# Patient Record
Sex: Male | Born: 1950 | Race: White | Hispanic: Yes | Marital: Married | Smoking: Former smoker
Health system: Southern US, Community
[De-identification: ages and names within clinical notes are randomized; demographics above are authoritative.]

## PROBLEM LIST (undated history)

## (undated) DIAGNOSIS — I10 Essential (primary) hypertension: Secondary | ICD-10-CM

## (undated) DIAGNOSIS — E039 Hypothyroidism, unspecified: Secondary | ICD-10-CM

## (undated) DIAGNOSIS — I499 Cardiac arrhythmia, unspecified: Secondary | ICD-10-CM

## (undated) DIAGNOSIS — M1990 Unspecified osteoarthritis, unspecified site: Secondary | ICD-10-CM

## (undated) HISTORY — PX: OTHER SURGICAL HISTORY: SHX169

## (undated) HISTORY — PX: EYE SURGERY: SHX253

---

## 2020-08-20 ENCOUNTER — Other Ambulatory Visit: Payer: Self-pay | Admitting: Orthopedic Surgery

## 2020-08-20 DIAGNOSIS — Z01811 Encounter for preprocedural respiratory examination: Secondary | ICD-10-CM

## 2020-08-28 ENCOUNTER — Other Ambulatory Visit (HOSPITAL_COMMUNITY): Payer: Self-pay

## 2020-09-08 NOTE — Progress Notes (Signed)
[place]- Preparing for Total Shoulder Arthroplasty    Before surgery, you can play an important role. Because skin is not sterile, your skin needs to be as free of germs as possible. You can reduce the number of germs on your skin by using the following products. . Benzoyl Peroxide Gel o Reduces the number of germs present on the skin o Applied twice a day to shoulder area starting two days before surgery    ==================================================================  Please follow these instructions carefully:  BENZOYL PEROXIDE 5% GEL  Please do not use if you have an allergy to benzoyl peroxide.   If your skin becomes reddened/irritated stop using the benzoyl peroxide.  Starting two days before surgery, apply as follows: 1. Apply benzoyl peroxide in the morning and at night. Apply after taking a shower. If you are not taking a shower clean entire shoulder front, back, and side along with the armpit with a clean wet washcloth.  2. Place a quarter-sized dollop on your shoulder and rub in thoroughly, making sure to cover the front, back, and side of your shoulder, along with the armpit.   2 days before ____ AM   ____ PM              1 day before ____ AM   ____ PM                         3. Do this twice a day for two days.  (Last application is the night before surgery, AFTER using the CHG soap as described below).  4. Do NOT apply benzoyl peroxide gel on the day of surgery.  

## 2020-09-08 NOTE — Progress Notes (Signed)
DUE TO COVID-19 ONLY ONE VISITOR IS ALLOWED TO COME WITH YOU AND STAY IN THE WAITING ROOM ONLY DURING PRE OP AND PROCEDURE DAY OF SURGERY. THE 1 VISITOR  MAY VISIT WITH YOU AFTER SURGERY IN YOUR PRIVATE ROOM DURING VISITING HOURS ONLY!  YOU NEED TO HAVE A COVID 19 TEST ON___2/14/2022 ____ @_______ , THIS TEST MUST BE DONE BEFORE SURGERY,  COVID TESTING SITE [address] [place]  , IT IS ON THE RIGHT GOING OUT [address] APPROXIMATELY  2 MINUTES PAST [employer] ON THE RIGHT. ONCE YOUR COVID TEST IS COMPLETED,  PLEASE BEGIN THE QUARANTINE INSTRUCTIONS AS OUTLINED IN YOUR HANDOUT.                Roger Robbins  09/08/2020   Your procedure is scheduled on: 09/17/2020    Report to [facility] Main  Entrance   Report to admitting at     0715 AM     Call this number if you have problems the morning of surgery [phone]    REMEMBER: NO  SOLID FOOD CANDY OR GUM AFTER MIDNIGHT. CLEAR LIQUIDS UNTIL     0645am        . NOTHING BY MOUTH EXCEPT CLEAR LIQUIDS UNTIL    . PLEASE FINISH ENSURE DRINK PER SURGEON ORDER  WHICH NEEDS TO BE COMPLETED AT   0645am    .      CLEAR LIQUID DIET   Foods Allowed                                                                    Coffee and tea, regular and decaf                            Fruit ices (not with fruit pulp)                                      Iced Popsicles                                    Carbonated beverages, regular and diet                                    Cranberry, grape and apple juices Sports drinks like Gatorade Lightly seasoned clear broth or consume(fat free) Sugar, honey syrup ___________________________________________________________________      BRUSH YOUR TEETH MORNING OF SURGERY AND RINSE YOUR MOUTH OUT, NO CHEWING GUM CANDY OR MINTS.     Take these medicines the morning of surgery with A SIP OF WATER:  Amiodarone, Zyrtec, Clonidine, hydralazine, synthroid, Bystolic  DO NOT  TAKE ANY DIABETIC MEDICATIONS DAY OF YOUR SURGERY                               You may not have any metal on your body including hair pins and  piercings  Do not wear jewelry, make-up, lotions, powders or perfumes, deodorant             Do not wear nail polish on your fingernails.  Do not shave  48 hours prior to surgery.              Men may shave face and neck.   Do not bring valuables to the hospital. [place].  Contacts, dentures or bridgework may not be worn into surgery.  Leave suitcase in the car. After surgery it may be brought to your room.     Patients discharged the day of surgery will not be allowed to drive home. IF YOU ARE HAVING SURGERY AND GOING HOME THE SAME DAY, YOU MUST HAVE AN ADULT TO DRIVE YOU HOME AND BE WITH YOU FOR 24 HOURS. YOU MAY GO HOME BY TAXI OR UBER OR ORTHERWISE, BUT AN ADULT MUST ACCOMPANY YOU HOME AND STAY WITH YOU FOR 24 HOURS.  Name and phone number of your driver:  Special Instructions: N/A              Please read over the following fact sheets you were given: _____________________________________________________________________  [facility] - Preparing for Surgery Before surgery, you can play an important role.  Because skin is not sterile, your skin needs to be as free of germs as possible.  You can reduce the number of germs on your skin by washing with CHG (chlorahexidine gluconate) soap before surgery.  CHG is an antiseptic cleaner which kills germs and bonds with the skin to continue killing germs even after washing. Please DO NOT use if you have an allergy to CHG or antibacterial soaps.  If your skin becomes reddened/irritated stop using the CHG and inform your nurse when you arrive at Short Stay. Do not shave (including legs and underarms) for at least 48 hours prior to the first CHG shower.  You may shave your face/neck. Please follow these instructions carefully:  1.  Shower with  CHG Soap the night before surgery and the  morning of Surgery.  2.  If you choose to wash your hair, wash your hair first as usual with your  normal  shampoo.  3.  After you shampoo, rinse your hair and body thoroughly to remove the  shampoo.                           4.  Use CHG as you would any other liquid soap.  You can apply chg directly  to the skin and wash                       Gently with a scrungie or clean washcloth.  5.  Apply the CHG Soap to your body ONLY FROM THE NECK DOWN.   Do not use on face/ open                           Wound or open sores. Avoid contact with eyes, ears mouth and genitals (private parts).                       Wash face,  Genitals (private parts) with your normal soap.             6.  Wash thoroughly, paying special attention to the area where your surgery  will be performed.  7.  Thoroughly rinse your body with warm water from the neck down.  8.  DO NOT shower/wash with your normal soap after using and rinsing off  the CHG Soap.                9.  Pat yourself dry with a clean towel.            10.  Wear clean pajamas.            11.  Place clean sheets on your bed the night of your first shower and do not  sleep with pets. Day of Surgery : Do not apply any lotions/deodorants the morning of surgery.  Please wear clean clothes to the hospital/surgery center.  FAILURE TO FOLLOW THESE INSTRUCTIONS MAY RESULT IN THE CANCELLATION OF YOUR SURGERY PATIENT SIGNATURE_________________________________  NURSE SIGNATURE__________________________________  ________________________________________________________________________

## 2020-09-09 ENCOUNTER — Encounter (HOSPITAL_COMMUNITY)
Admission: RE | Admit: 2020-09-09 | Discharge: 2020-09-09 | Disposition: A | Discharge status: Home / Self Care | Payer: Medicare Other | Source: Ambulatory Visit | Attending: Orthopedic Surgery | Admitting: Orthopedic Surgery

## 2020-09-09 ENCOUNTER — Other Ambulatory Visit: Payer: Self-pay

## 2020-09-09 ENCOUNTER — Encounter (HOSPITAL_COMMUNITY): Payer: Self-pay

## 2020-09-09 ENCOUNTER — Ambulatory Visit (HOSPITAL_COMMUNITY)
Admission: RE | Admit: 2020-09-09 | Discharge: 2020-09-09 | Disposition: A | Discharge status: Home / Self Care | Payer: Medicare Other | Source: Ambulatory Visit | Attending: Orthopedic Surgery | Admitting: Orthopedic Surgery

## 2020-09-09 DIAGNOSIS — I10 Essential (primary) hypertension: Secondary | ICD-10-CM | POA: Insufficient documentation

## 2020-09-09 DIAGNOSIS — Z01818 Encounter for other preprocedural examination: Secondary | ICD-10-CM | POA: Diagnosis not present

## 2020-09-09 DIAGNOSIS — Z87891 Personal history of nicotine dependence: Secondary | ICD-10-CM | POA: Diagnosis not present

## 2020-09-09 DIAGNOSIS — Z7982 Long term (current) use of aspirin: Secondary | ICD-10-CM | POA: Diagnosis not present

## 2020-09-09 DIAGNOSIS — Z79899 Other long term (current) drug therapy: Secondary | ICD-10-CM | POA: Insufficient documentation

## 2020-09-09 DIAGNOSIS — Z01811 Encounter for preprocedural respiratory examination: Secondary | ICD-10-CM | POA: Insufficient documentation

## 2020-09-09 DIAGNOSIS — M75101 Unspecified rotator cuff tear or rupture of right shoulder, not specified as traumatic: Secondary | ICD-10-CM | POA: Insufficient documentation

## 2020-09-09 HISTORY — DX: Unspecified osteoarthritis, unspecified site: M19.90

## 2020-09-09 HISTORY — DX: Cardiac arrhythmia, unspecified: I49.9

## 2020-09-09 HISTORY — DX: Essential (primary) hypertension: I10

## 2020-09-09 HISTORY — DX: Hypothyroidism, unspecified: E03.9

## 2020-09-09 LAB — CBC WITH DIFFERENTIAL/PLATELET
Abs Immature Granulocytes: 0.04 10*3/uL (ref 0.00–0.07)
Basophils Absolute: 0.1 10*3/uL (ref 0.0–0.1)
Basophils Relative: 1 %
Eosinophils Absolute: 0.2 10*3/uL (ref 0.0–0.5)
Eosinophils Relative: 2 %
HCT: 39 % (ref 39.0–52.0)
Hemoglobin: 13 g/dL (ref 13.0–17.0)
Immature Granulocytes: 1 %
Lymphocytes Relative: 18 %
Lymphs Abs: 1.4 10*3/uL (ref 0.7–4.0)
MCH: 32.4 pg (ref 26.0–34.0)
MCHC: 33.3 g/dL (ref 30.0–36.0)
MCV: 97.3 fL (ref 80.0–100.0)
Monocytes Absolute: 0.6 10*3/uL (ref 0.1–1.0)
Monocytes Relative: 8 %
Neutro Abs: 5.5 10*3/uL (ref 1.7–7.7)
Neutrophils Relative %: 70 %
Platelets: 276 10*3/uL (ref 150–400)
RBC: 4.01 MIL/uL — ABNORMAL LOW (ref 4.22–5.81)
RDW: 12.4 % (ref 11.5–15.5)
WBC: 7.9 10*3/uL (ref 4.0–10.5)
nRBC: 0 % (ref 0.0–0.2)

## 2020-09-09 LAB — URINALYSIS, ROUTINE W REFLEX MICROSCOPIC
Bilirubin Urine: NEGATIVE
Glucose, UA: NEGATIVE mg/dL
Hgb urine dipstick: NEGATIVE
Ketones, ur: NEGATIVE mg/dL
Leukocytes,Ua: NEGATIVE
Nitrite: NEGATIVE
Protein, ur: NEGATIVE mg/dL
Specific Gravity, Urine: 1.011 (ref 1.005–1.030)
pH: 6 (ref 5.0–8.0)

## 2020-09-09 LAB — COMPREHENSIVE METABOLIC PANEL
ALT: 22 U/L (ref 0–44)
AST: 25 U/L (ref 15–41)
Albumin: 4 g/dL (ref 3.5–5.0)
Alkaline Phosphatase: 83 U/L (ref 38–126)
Anion gap: 9 (ref 5–15)
BUN: 18 mg/dL (ref 8–23)
CO2: 27 mmol/L (ref 22–32)
Calcium: 9.1 mg/dL (ref 8.9–10.3)
Chloride: 103 mmol/L (ref 98–111)
Creatinine, Ser: 1.36 mg/dL — ABNORMAL HIGH (ref 0.61–1.24)
GFR, Estimated: 56 mL/min — ABNORMAL LOW (ref >60)
Glucose, Bld: 100 mg/dL — ABNORMAL HIGH (ref 70–99)
Potassium: 4.7 mmol/L (ref 3.5–5.1)
Sodium: 139 mmol/L (ref 135–145)
Total Bilirubin: 0.8 mg/dL (ref 0.3–1.2)
Total Protein: 7.6 g/dL (ref 6.5–8.1)

## 2020-09-09 LAB — PROTIME-INR
INR: 1 (ref 0.8–1.2)
Prothrombin Time: 12.9 seconds (ref 11.4–15.2)

## 2020-09-09 LAB — SURGICAL PCR SCREEN
MRSA, PCR: NEGATIVE
Staphylococcus aureus: NEGATIVE

## 2020-09-09 LAB — APTT: aPTT: 32 seconds (ref 24–36)

## 2020-09-09 NOTE — Progress Notes (Addendum)
Anesthesia Review:  PCP: DR Argentina Ponder in [place]  Cardiologist : DR Sherren Mocha in [place] Requested by fax with Medical Release most recent office visit notes, ekg and echo.  PT reports LOV as being either 10/21 or 11/21.   04/15/20, 02/14/2020- LOv notes from Dr Leonie Green EKg-02/24/20, Echo 04/15/20 -   Chest x-ray : EKG :09/09/20  Echo : Stress test: Cardiac Cath :  Activity level: can do a fight of stairs without difficulty  Sleep Study/ CPAP :no  Fasting Blood Sugar :      / Checks Blood Sugar -- times a day:   Blood Thinner/ Instructions /Last Dose: ASA / Instructions/ Last Dose :  81 mg aspirin  Pulse by [employer] was 40-45.  Pt reports pulse as normal being 40-45.  Pt voices no complaints at preop.  Called DR Leonie Green and will need to send Medical Release form to obtain records.  PT signed Medical Release Form. Faxed to request records.  Gayla Medicus made aware heart rate at 40 and asymptomatic.  EKG done. Heart rate 38 on EKG.    REport shown to Jodell Cipro,, PA . Pt reports he has not taken meds yet this am and has in his truck.  Made Gayla Medicus aware.  No new orders given. B/P 168/75 at preop.  Pt reports blood pressure normally elevated " I have white coat syndrome"

## 2020-09-15 NOTE — Progress Notes (Signed)
Anesthesia Chart Review   Case: [id number] Date/Time: 09/17/20 0931   Procedure: REVERSE SHOULDER ARTHROPLASTY (Right Shoulder)   Anesthesia type: Choice   Pre-op diagnosis: RIGHT SHOULDER ROTATOR CUFF TEAR ARTHROPATHY   Location: [facility] ROOM 07 / [facility]   Surgeons: Jones Broom, MD      DISCUSSION:70 y.o. former msoker with h/o HTN, dilated cardiomyopathy, bradycardia, right shoulder rotator cuff tear scheduled for above procedure 09/17/2020 with Dr. Jones Broom.   Pt last seen by cardiology 04/15/2020. Notes on chart.  Per OV note, pt euvolemic, moderate MR is asymptomatic, frequent ventricular ectopy followed by EP.  Denies CV sx.   Echo 04/17/2020 with EF 60-65%, mild mitral regurgitation.    VS: BP (!) 168/75   Pulse (!) 40   Temp 36.6 C (Oral)   Resp 16   SpO2 100%   PROVIDERS: Vanessa , FNP  Is PCP   Altamease Oiler, MD is Cardiologist   LABS: Labs reviewed: Acceptable for surgery. (all labs ordered are listed, but only abnormal results are displayed)  Labs Reviewed  CBC WITH DIFFERENTIAL/PLATELET - Abnormal; Notable for the following components:      Result Value   RBC 4.01 (*)    All other components within normal limits  COMPREHENSIVE METABOLIC PANEL - Abnormal; Notable for the following components:   Glucose, Bld 100 (*)    Creatinine, Ser 1.36 (*)    GFR, Estimated 56 (*)    All other components within normal limits  SURGICAL PCR SCREEN  PROTIME-INR  APTT  URINALYSIS, ROUTINE W REFLEX MICROSCOPIC  TYPE AND SCREEN     IMAGES:   EKG: 09/09/20 Rate 38 bpm  Marked sinus bradycardia T wave abnormality, consider anterior ischemia  CV: Echo 04/17/2020 Impression Normal LV size with normal systolic function.  The ejection fraction is 60-65%. There is normal left ventricular wall thickness Normal right ventricular size with normal function Moderately dilated left atrium Poorly seen right atrium There is mild mitral regurgitation There is mild  tricuspid regurgitation There is mild pulmonic regurgitation Past Medical History:  Diagnosis Date  . Arthritis   . Dysrhythmia    PVCS   . Hypertension   . Hypothyroidism     Past Surgical History:  Procedure Laterality Date  . 3 FINERS ON LEFT HAND REATTACHED     . BILATERAL BLEPHOROPLASTY     . EYE SURGERY     BILATERAL CATARACT SURGERY   . REBUILT KNEE SURGERY     . RIGHT KNEE REPLACEMENT       MEDICATIONS: . acetaminophen (TYLENOL) 500 MG tablet  . amiodarone (PACERONE) 200 MG tablet  . aspirin EC 81 MG tablet  . cetirizine (ZYRTEC ALLERGY) 10 MG tablet  . Cholecalciferol (D3) 50 MCG (2000 UT) TABS  . cloNIDine (CATAPRES) 0.1 MG tablet  . ezetimibe (ZETIA) 10 MG tablet  . hydrALAZINE (APRESOLINE) 100 MG tablet  . latanoprost (XALATAN) 0.005 % ophthalmic solution  . levothyroxine (SYNTHROID) 125 MCG tablet  . metolazone (ZAROXOLYN) 5 MG tablet  . naproxen sodium (ALEVE) 220 MG tablet  . nebivolol (BYSTOLIC) 10 MG tablet  . vitamin C (ASCORBIC ACID) 500 MG tablet   No current facility-administered medications for this encounter.    Jodell Cipro, PA-C [facility]-Surgical Testing [phone]

## 2020-09-17 ENCOUNTER — Encounter (HOSPITAL_COMMUNITY)
Admission: RE | Disposition: A | Discharge status: Home / Self Care | Payer: Self-pay | Source: Home / Self Care | Attending: Orthopedic Surgery

## 2020-09-17 ENCOUNTER — Ambulatory Visit (HOSPITAL_COMMUNITY): Payer: Medicare Other | Admitting: Physician Assistant

## 2020-09-17 ENCOUNTER — Ambulatory Visit (HOSPITAL_COMMUNITY)
Admission: RE | Admit: 2020-09-17 | Discharge: 2020-09-17 | Disposition: A | Discharge status: Home / Self Care | Payer: Medicare Other | Attending: Orthopedic Surgery | Admitting: Orthopedic Surgery

## 2020-09-17 ENCOUNTER — Ambulatory Visit (HOSPITAL_COMMUNITY): Payer: Medicare Other | Admitting: Certified Registered Nurse Anesthetist

## 2020-09-17 ENCOUNTER — Encounter (HOSPITAL_COMMUNITY): Payer: Self-pay | Admitting: Orthopedic Surgery

## 2020-09-17 ENCOUNTER — Ambulatory Visit (HOSPITAL_COMMUNITY): Payer: Medicare Other

## 2020-09-17 DIAGNOSIS — M75101 Unspecified rotator cuff tear or rupture of right shoulder, not specified as traumatic: Secondary | ICD-10-CM | POA: Diagnosis not present

## 2020-09-17 DIAGNOSIS — Z79899 Other long term (current) drug therapy: Secondary | ICD-10-CM | POA: Diagnosis not present

## 2020-09-17 DIAGNOSIS — Z880 Allergy status to penicillin: Secondary | ICD-10-CM | POA: Insufficient documentation

## 2020-09-17 DIAGNOSIS — Z79890 Hormone replacement therapy (postmenopausal): Secondary | ICD-10-CM | POA: Insufficient documentation

## 2020-09-17 DIAGNOSIS — Z87891 Personal history of nicotine dependence: Secondary | ICD-10-CM | POA: Insufficient documentation

## 2020-09-17 DIAGNOSIS — Z96611 Presence of right artificial shoulder joint: Secondary | ICD-10-CM

## 2020-09-17 DIAGNOSIS — Z7982 Long term (current) use of aspirin: Secondary | ICD-10-CM | POA: Diagnosis not present

## 2020-09-17 DIAGNOSIS — M19011 Primary osteoarthritis, right shoulder: Secondary | ICD-10-CM | POA: Insufficient documentation

## 2020-09-17 DIAGNOSIS — M25711 Osteophyte, right shoulder: Secondary | ICD-10-CM | POA: Insufficient documentation

## 2020-09-17 HISTORY — PX: REVERSE SHOULDER ARTHROPLASTY: SHX5054

## 2020-09-17 LAB — ABO/RH: ABO/RH(D): A POS

## 2020-09-17 LAB — TYPE AND SCREEN
ABO/RH(D): A POS
Antibody Screen: NEGATIVE

## 2020-09-17 SURGERY — ARTHROPLASTY, SHOULDER, TOTAL, REVERSE
Anesthesia: Regional | Site: Shoulder | Laterality: Right

## 2020-09-17 MED ORDER — PROPOFOL 10 MG/ML IV BOLUS
INTRAVENOUS | Status: DC | PRN
  Administered 2020-09-17: 160 mg via INTRAVENOUS

## 2020-09-17 MED ORDER — CEFAZOLIN SODIUM-DEXTROSE 2-4 GM/100ML-% IV SOLN
INTRAVENOUS | Status: AC
  Filled 2020-09-17: qty 100

## 2020-09-17 MED ORDER — ONDANSETRON HCL 4 MG/2ML IJ SOLN
INTRAMUSCULAR | Status: AC
  Filled 2020-09-17: qty 2

## 2020-09-17 MED ORDER — ROCURONIUM BROMIDE 10 MG/ML (PF) SYRINGE
PREFILLED_SYRINGE | INTRAVENOUS | Status: AC
  Filled 2020-09-17: qty 10

## 2020-09-17 MED ORDER — BUPIVACAINE LIPOSOME 1.3 % IJ SUSP
INTRAMUSCULAR | Status: DC | PRN
  Administered 2020-09-17: 10 mL via PERINEURAL

## 2020-09-17 MED ORDER — DEXAMETHASONE SODIUM PHOSPHATE 10 MG/ML IJ SOLN
INTRAMUSCULAR | Status: DC | PRN
  Administered 2020-09-17: 5 mg

## 2020-09-17 MED ORDER — FENTANYL CITRATE (PF) 100 MCG/2ML IJ SOLN
INTRAMUSCULAR | Status: AC
  Filled 2020-09-17: qty 2

## 2020-09-17 MED ORDER — OXYCODONE-ACETAMINOPHEN 5-325 MG PO TABS: ORAL_TABLET | ORAL | 0 refills | Status: AC

## 2020-09-17 MED ORDER — EPHEDRINE 5 MG/ML INJ
INTRAVENOUS | Status: AC
  Filled 2020-09-17: qty 10

## 2020-09-17 MED ORDER — HYDROMORPHONE HCL 1 MG/ML IJ SOLN: 0.2500 mg | INTRAMUSCULAR | Status: DC | PRN

## 2020-09-17 MED ORDER — ROCURONIUM BROMIDE 10 MG/ML (PF) SYRINGE
PREFILLED_SYRINGE | INTRAVENOUS | Status: DC | PRN
  Administered 2020-09-17: 60 mg via INTRAVENOUS

## 2020-09-17 MED ORDER — OXYCODONE HCL 5 MG PO TABS
5.0000 mg | ORAL_TABLET | Freq: Once | ORAL | Status: AC | PRN
  Administered 2020-09-17: 5 mg via ORAL

## 2020-09-17 MED ORDER — PHENYLEPHRINE HCL-NACL 10-0.9 MG/250ML-% IV SOLN
INTRAVENOUS | Status: DC | PRN
  Administered 2020-09-17: 30 ug/min via INTRAVENOUS

## 2020-09-17 MED ORDER — GLYCOPYRROLATE PF 0.2 MG/ML IJ SOSY
PREFILLED_SYRINGE | INTRAMUSCULAR | Status: DC | PRN
  Administered 2020-09-17: .2 mg via INTRAVENOUS

## 2020-09-17 MED ORDER — OXYCODONE HCL 5 MG/5ML PO SOLN: 5.0000 mg | Freq: Once | ORAL | Status: AC | PRN

## 2020-09-17 MED ORDER — OXYCODONE HCL 5 MG PO TABS
ORAL_TABLET | ORAL | Status: AC
  Filled 2020-09-17: qty 1

## 2020-09-17 MED ORDER — LACTATED RINGERS IV SOLN: INTRAVENOUS | Status: DC

## 2020-09-17 MED ORDER — 0.9 % SODIUM CHLORIDE (POUR BTL) OPTIME
TOPICAL | Status: DC | PRN
  Administered 2020-09-17: 1000 mL

## 2020-09-17 MED ORDER — FENTANYL CITRATE (PF) 250 MCG/5ML IJ SOLN
INTRAMUSCULAR | Status: DC | PRN
  Administered 2020-09-17 (×2): 50 ug via INTRAVENOUS
  Administered 2020-09-17: 100 ug via INTRAVENOUS

## 2020-09-17 MED ORDER — CEFAZOLIN SODIUM-DEXTROSE 2-4 GM/100ML-% IV SOLN
2.0000 g | INTRAVENOUS | Status: AC
  Administered 2020-09-17: 2 g via INTRAVENOUS

## 2020-09-17 MED ORDER — DEXAMETHASONE SODIUM PHOSPHATE 10 MG/ML IJ SOLN
INTRAMUSCULAR | Status: AC
  Filled 2020-09-17: qty 1

## 2020-09-17 MED ORDER — LACTATED RINGERS IV BOLUS: 500.0000 mL | Freq: Once | INTRAVENOUS | Status: DC

## 2020-09-17 MED ORDER — WATER FOR IRRIGATION, STERILE IR SOLN
Status: DC | PRN
  Administered 2020-09-17: 2000 mL

## 2020-09-17 MED ORDER — SODIUM CHLORIDE 0.9 % IR SOLN
Status: DC | PRN
  Administered 2020-09-17: 1000 mL

## 2020-09-17 MED ORDER — SUGAMMADEX SODIUM 200 MG/2ML IV SOLN
INTRAVENOUS | Status: DC | PRN
  Administered 2020-09-17: 300 mg via INTRAVENOUS

## 2020-09-17 MED ORDER — FENTANYL CITRATE (PF) 100 MCG/2ML IJ SOLN
50.0000 ug | INTRAMUSCULAR | Status: DC
  Administered 2020-09-17: 100 ug via INTRAVENOUS
  Administered 2020-09-17: 50 ug via INTRAVENOUS
  Filled 2020-09-17: qty 2

## 2020-09-17 MED ORDER — PROPOFOL 10 MG/ML IV BOLUS
INTRAVENOUS | Status: AC
  Filled 2020-09-17: qty 20

## 2020-09-17 MED ORDER — MIDAZOLAM HCL 2 MG/2ML IJ SOLN
1.0000 mg | INTRAMUSCULAR | Status: DC
  Administered 2020-09-17: 1 mg via INTRAVENOUS
  Filled 2020-09-17: qty 2

## 2020-09-17 MED ORDER — BUPIVACAINE HCL (PF) 0.5 % IJ SOLN
INTRAMUSCULAR | Status: DC | PRN
  Administered 2020-09-17: 10 mL via PERINEURAL

## 2020-09-17 MED ORDER — PROMETHAZINE HCL 25 MG/ML IJ SOLN: 6.2500 mg | INTRAMUSCULAR | Status: DC | PRN

## 2020-09-17 MED ORDER — EPHEDRINE SULFATE-NACL 50-0.9 MG/10ML-% IV SOSY
PREFILLED_SYRINGE | INTRAVENOUS | Status: DC | PRN
  Administered 2020-09-17: 5 mg via INTRAVENOUS

## 2020-09-17 MED ORDER — LIDOCAINE 2% (20 MG/ML) 5 ML SYRINGE
INTRAMUSCULAR | Status: DC | PRN
  Administered 2020-09-17: 60 mg via INTRAVENOUS

## 2020-09-17 MED ORDER — VASOPRESSIN 20 UNIT/ML IV SOLN
INTRAVENOUS | Status: AC
  Filled 2020-09-17: qty 1

## 2020-09-17 MED ORDER — TRANEXAMIC ACID-NACL 1000-0.7 MG/100ML-% IV SOLN
1000.0000 mg | INTRAVENOUS | Status: AC
  Administered 2020-09-17: 1000 mg via INTRAVENOUS
  Filled 2020-09-17: qty 100

## 2020-09-17 MED ORDER — DEXAMETHASONE SODIUM PHOSPHATE 10 MG/ML IJ SOLN
INTRAMUSCULAR | Status: DC | PRN
  Administered 2020-09-17: 5 mg via INTRAVENOUS

## 2020-09-17 MED ORDER — LIDOCAINE HCL (PF) 2 % IJ SOLN
INTRAMUSCULAR | Status: AC
  Filled 2020-09-17: qty 5

## 2020-09-17 MED ORDER — ONDANSETRON HCL 4 MG/2ML IJ SOLN
INTRAMUSCULAR | Status: DC | PRN
  Administered 2020-09-17: 4 mg via INTRAVENOUS

## 2020-09-17 MED ORDER — CHLORHEXIDINE GLUCONATE 0.12 % MT SOLN
15.0000 mL | Freq: Once | OROMUCOSAL | Status: AC
  Administered 2020-09-17: 15 mL via OROMUCOSAL

## 2020-09-17 MED ORDER — TIZANIDINE HCL 4 MG PO TABS: 4.0000 mg | ORAL_TABLET | Freq: Three times a day (TID) | ORAL | 1 refills | Status: AC | PRN

## 2020-09-17 SURGICAL SUPPLY — 73 items
BAG ZIPLOCK 12X15 (MISCELLANEOUS) ×2 IMPLANT
BASEPLATE P2 COATD GLND 6.5X30 (Shoulder) ×1 IMPLANT
BIT DRILL 1.6MX128 (BIT) IMPLANT
BIT DRILL 2.5 DIA 127 CALI (BIT) ×2 IMPLANT
BIT DRILL 4 DIA CALIBRATED (BIT) ×2 IMPLANT
BLADE SAW SAG 73X25 THK (BLADE) ×1
BLADE SAW SGTL 73X25 THK (BLADE) ×1 IMPLANT
BOOTIES KNEE HIGH SLOAN (MISCELLANEOUS) ×4 IMPLANT
CLSR STERI-STRIP ANTIMIC 1/2X4 (GAUZE/BANDAGES/DRESSINGS) ×2 IMPLANT
COOLER ICEMAN CLASSIC (MISCELLANEOUS) ×2 IMPLANT
COVER BACK TABLE 60X90IN (DRAPES) ×2 IMPLANT
COVER SURGICAL LIGHT HANDLE (MISCELLANEOUS) ×2 IMPLANT
COVER WAND RF STERILE (DRAPES) ×2 IMPLANT
DRAPE INCISE IOBAN 66X45 STRL (DRAPES) ×2 IMPLANT
DRAPE ORTHO SPLIT 77X108 STRL (DRAPES) ×2
DRAPE POUCH INSTRU U-SHP 10X18 (DRAPES) ×2 IMPLANT
DRAPE SHEET LG 3/4 BI-LAMINATE (DRAPES) ×2 IMPLANT
DRAPE SURG 17X11 SM STRL (DRAPES) ×2 IMPLANT
DRAPE SURG ORHT 6 SPLT 77X108 (DRAPES) ×2 IMPLANT
DRAPE TOP 10253 STERILE (DRAPES) ×2 IMPLANT
DRAPE U-SHAPE 47X51 STRL (DRAPES) ×2 IMPLANT
DRSG AQUACEL AG ADV 3.5X 6 (GAUZE/BANDAGES/DRESSINGS) ×2 IMPLANT
DURAPREP 26ML APPLICATOR (WOUND CARE) ×4 IMPLANT
ELECT BLADE TIP CTD 4 INCH (ELECTRODE) ×2 IMPLANT
ELECT REM PT RETURN 15FT ADLT (MISCELLANEOUS) ×2 IMPLANT
GLOVE SRG 8 PF TXTR STRL LF DI (GLOVE) ×1 IMPLANT
GLOVE SURG ENC MOIS LTX SZ7 (GLOVE) ×2 IMPLANT
GLOVE SURG ENC MOIS LTX SZ7.5 (GLOVE) ×2 IMPLANT
GLOVE SURG UNDER POLY LF SZ7 (GLOVE) ×2 IMPLANT
GLOVE SURG UNDER POLY LF SZ8 (GLOVE) ×1
GOWN STRL REUS W/TWL LRG LVL3 (GOWN DISPOSABLE) ×2 IMPLANT
GOWN STRL REUS W/TWL XL LVL3 (GOWN DISPOSABLE) ×2 IMPLANT
HANDPIECE INTERPULSE COAX TIP (DISPOSABLE) ×1
HOOD PEEL AWAY FLYTE STAYCOOL (MISCELLANEOUS) ×6 IMPLANT
INSERT EPOLY STND HUMERUS 32MM (Shoulder) ×2 IMPLANT
INSERT EPOLYSTD HUMERUS 32MM (Shoulder) ×1 IMPLANT
KIT BASIN OR (CUSTOM PROCEDURE TRAY) ×2 IMPLANT
KIT TURNOVER KIT A (KITS) ×2 IMPLANT
MANIFOLD NEPTUNE II (INSTRUMENTS) ×2 IMPLANT
NEEDLE TROCAR POINT SZ 2 1/2 (NEEDLE) IMPLANT
NS IRRIG 1000ML POUR BTL (IV SOLUTION) ×2 IMPLANT
P2 COATDE GLNOID BSEPLT 6.5X30 (Shoulder) ×2 IMPLANT
PACK SHOULDER (CUSTOM PROCEDURE TRAY) ×2 IMPLANT
PAD COLD SHLDR WRAP-ON (PAD) ×2 IMPLANT
PROTECTOR NERVE ULNAR (MISCELLANEOUS) IMPLANT
RESTRAINT HEAD UNIVERSAL NS (MISCELLANEOUS) ×2 IMPLANT
RETRIEVER SUT HEWSON (MISCELLANEOUS) IMPLANT
SCREW BONE LOCKING RSP 5.0X34 (Screw) ×4 IMPLANT
SCREW BONE RSP LOCK 5X18 (Screw) ×2 IMPLANT
SCREW BONE RSP LOCK 5X34 (Screw) ×2 IMPLANT
SCREW BONE RSP LOCKING 18MM LG (Screw) ×4 IMPLANT
SCREW RETAIN W/HEAD 32MM (Shoulder) ×2 IMPLANT
SET HNDPC FAN SPRY TIP SCT (DISPOSABLE) ×1 IMPLANT
SLING ARM FOAM STRAP LRG (SOFTGOODS) ×2 IMPLANT
SLING ARM IMMOBILIZER LRG (SOFTGOODS) ×2 IMPLANT
SLING ARM IMMOBILIZER MED (SOFTGOODS) IMPLANT
SPONGE LAP 18X18 RF (DISPOSABLE) ×2 IMPLANT
STEM HUMERAL STD SHELL 14X48 (Miscellaneous) ×2 IMPLANT
STRIP CLOSURE SKIN 1/2X4 (GAUZE/BANDAGES/DRESSINGS) ×4 IMPLANT
SUCTION FRAZIER HANDLE 10FR (MISCELLANEOUS)
SUCTION TUBE FRAZIER 10FR DISP (MISCELLANEOUS) IMPLANT
SUPPORT WRAP ARM LG (MISCELLANEOUS) IMPLANT
SUT ETHIBOND 2 V 37 (SUTURE) IMPLANT
SUT FIBERWIRE #2 38 REV NDL BL (SUTURE)
SUT MNCRL AB 4-0 PS2 18 (SUTURE) ×2 IMPLANT
SUT VIC AB 2-0 CT1 27 (SUTURE) ×1
SUT VIC AB 2-0 CT1 TAPERPNT 27 (SUTURE) ×1 IMPLANT
SUTURE FIBERWR#2 38 REV NDL BL (SUTURE) IMPLANT
TAPE LABRALWHITE 1.5X36 (TAPE) IMPLANT
TAPE SUT LABRALTAP WHT/BLK (SUTURE) IMPLANT
TOWEL OR 17X26 10 PK STRL BLUE (TOWEL DISPOSABLE) ×2 IMPLANT
TOWEL OR NON WOVEN STRL DISP B (DISPOSABLE) ×2 IMPLANT
WATER STERILE IRR 1000ML POUR (IV SOLUTION) ×2 IMPLANT

## 2020-09-17 NOTE — Op Note (Signed)
Procedure(s): REVERSE SHOULDER ARTHROPLASTY Procedure Note  [name] male 70 y.o. 09/17/2020   Preoperative diagnosis: Right shoulder rotator cuff tear arthropathy  Postoperative diagnosis: Same  Procedure(s) and Anesthesia Type:    Right REVERSE SHOULDER ARTHROPLASTY - General   Indications:  70 y.o. 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 Pain and dysfunction interfered with quality of life and nonoperative treatment with activity modification, NSAIDS and injections failed.     Surgeon: Berline Lopes   Assistants: Damita Lack PA-C [facility] was present and scrubbed throughout the procedure and was essential in positioning, retraction, exposure, and closure)  Anesthesia: General endotracheal anesthesia with preoperative interscalene block given by the attending anesthesiologist     Procedure Detail  REVERSE SHOULDER ARTHROPLASTY   Estimated Blood Loss:  200 mL         Drains: none  Blood Given: none          Specimens: none        Complications:  * No complications entered in OR log *         Disposition: PACU - hemodynamically stable.         Condition: stable      OPERATIVE FINDINGS:  A DJO Altivate pressfit reverse total shoulder arthroplasty was placed with a  size 14 stem, a 32 standard glenosphere, and a standard-mm poly insert. The base plate  fixation was poor.  Bone quality was felt to be very poor.  PROCEDURE: The patient was identified in the preoperative holding area  where I personally marked the operative site after verifying site, side,  and procedure with the patient. An interscalene block given by  the attending anesthesiologist in the holding area and the patient was taken back to the operating room where all extremities were  carefully padded in position after general anesthesia was induced. She  was placed in a beach-chair position and the operative upper extremity was  prepped  and draped in a standard sterile fashion. An approximately 10-  cm incision was made from the tip of the coracoid process to the center  point of the humerus at the level of the axilla. Dissection was carried  down through subcutaneous tissues to the level of the cephalic vein  which was taken laterally with the deltoid. The pectoralis major was  retracted medially. The subdeltoid space was developed and the lateral  edge of the conjoined tendon was identified. The undersurface of  conjoined tendon was palpated and the musculocutaneous nerve was not in  the field. Retractor was placed underneath the conjoined and second  retractor was placed lateral into the deltoid. The circumflex humeral  artery and vessels were identified and clamped and coagulated. The  biceps tendon was absent.  The subscapularis was chronically torn.  The  joint was then gently externally rotated while the capsule was released  from the humeral neck around to just beyond the 6 o'clock position. At  this point, the joint was dislocated and the humeral head was presented  into the wound. The excessive osteophyte formation was removed with a  large rongeur.  The cutting guide was used to make the appropriate  head cut and the head was saved for potentially bone grafting.  The glenoid was exposed with the arm in an  abducted extended position. The anterior and posterior labrum were  completely excised and the capsule was released circumferentially to  allow for exposure of the glenoid for preparation. The 2.5 mm drill was  placed using the guide in 5-10 inferior angulation and the tap was then advanced in the same hole. Small and large reamers were then used. The tap was then removed and the Metaglene was then screwed in with poor purchase.  The peripheral guide was then used to drilled measured and filled peripheral locking screws.  Peripheral locking screws were also felt to have poor purchase.  Bone quality was felt to be  poor, but the baseplate did appear to be well fixed after placing all 4 peripheral screws.  The size 32 standard glenosphere was then impacted on the [facility] taper and the central screw was placed. The humerus was then again exposed and the diaphyseal reamers were used followed by the metaphyseal reamers. The final broach was left in place in the proximal trial was placed. The joint was reduced and with this implant it was felt that soft tissue tensioning was appropriate with excellent stability and excellent range of motion. Therefore, final humeral stem was placed press-fit.  And then the trial polyethylene inserts were tested again and the above implant was felt to be the most appropriate for final insertion. The joint was reduced taken through full range of motion and felt to be stable. Soft tissue tension was appropriate.  The joint was then copiously irrigated with pulse  lavage and the wound was then closed. The subscapularis was not repaired.  Skin was closed with 2-0 Vicryl in a deep dermal layer and 4-0  Monocryl for skin closure. Steri-Strips were applied. Sterile  dressings were then applied as well as a sling. The patient was allowed  to awaken from general anesthesia, transferred to stretcher, and taken  to recovery room in stable condition.   POSTOPERATIVE PLAN: The patient will be observed in the recovery room.  If he has good pain control with his regional block and is hemodynamically stable I think he could probably go home today with family.  If there is any concern we can keep him overnight for observation.

## 2020-09-17 NOTE — H&P (Signed)
Roger Robbins is an 70 y.o. male.   Chief Complaint: R shoulder pain and dysfunction HPI: Endstage R shoulder arthritis with irrepairable RCT significant pain and dysfunction, failed conservative measures.  Pain interferes with sleep and quality of life.   Past Medical History:  Diagnosis Date  . Arthritis   . Dysrhythmia    PVCS   . Hypertension   . Hypothyroidism     Past Surgical History:  Procedure Laterality Date  . 3 FINERS ON LEFT HAND REATTACHED     . BILATERAL BLEPHOROPLASTY     . EYE SURGERY     BILATERAL CATARACT SURGERY   . REBUILT KNEE SURGERY     . RIGHT KNEE REPLACEMENT       History reviewed. No pertinent family history. Social History:  reports that he quit smoking about 10 years ago. He has never used smokeless tobacco. He reports current alcohol use. He reports that he does not use drugs.  Allergies:  Allergies  Allergen Reactions  . Penicillins Other (See Comments)    Childhood reaction    Medications Prior to Admission  Medication Sig Dispense Refill  . acetaminophen (TYLENOL) 500 MG tablet Take 500-1,000 mg by mouth every 6 (six) hours as needed (pain.).    Marland Kitchen amiodarone (PACERONE) 200 MG tablet Take 200 mg by mouth daily.    Marland Kitchen aspirin EC 81 MG tablet Take 81 mg by mouth daily. Swallow whole.    . cetirizine (ZYRTEC) 10 MG tablet Take 10 mg by mouth daily.    . Cholecalciferol (D3) 50 MCG (2000 UT) TABS Take 2,000 Units by mouth daily.    . cloNIDine (CATAPRES) 0.1 MG tablet Take 0.1 mg by mouth 2 (two) times daily.    Marland Kitchen ezetimibe (ZETIA) 10 MG tablet Take 10 mg by mouth daily.    . hydrALAZINE (APRESOLINE) 100 MG tablet Take 100 mg by mouth 2 (two) times daily.    Marland Kitchen latanoprost (XALATAN) 0.005 % ophthalmic solution Place 1 drop into both eyes at bedtime.    Marland Kitchen levothyroxine (SYNTHROID) 125 MCG tablet Take 125 mcg by mouth daily before breakfast.    . metolazone (ZAROXOLYN) 5 MG tablet Take 5 mg by mouth 2 (two) times daily.    . naproxen sodium  (ALEVE) 220 MG tablet Take 440 mg by mouth daily.    . nebivolol (BYSTOLIC) 10 MG tablet Take 5 mg by mouth in the morning and at bedtime.    . vitamin C (ASCORBIC ACID) 500 MG tablet Take 500 mg by mouth daily.      No results found for this or any previous visit (from the past 48 hour(s)). No results found.  Review of Systems  All other systems reviewed and are negative.   Blood pressure 132/67, pulse (!) 39, temperature 98.1 F (36.7 C), temperature source Oral, resp. rate 11, height 5\' 10"  (1.778 m), weight 79.4 kg, SpO2 100 %. Physical Exam HENT:     Head: Atraumatic.  Eyes:     Extraocular Movements: Extraocular movements intact.  Cardiovascular:     Pulses: Normal pulses.  Pulmonary:     Effort: Pulmonary effort is normal.  Musculoskeletal:     Comments: R shoulder pain with limited ROM. NVID  Skin:    General: Skin is warm.  Neurological:     Mental Status: He is alert.  Psychiatric:        Mood and Affect: Mood normal.      Assessment/Plan R shoulder rotator cuff tear arthritis Plan  R reverse TSA Risks / benefits of surgery discussed Consent on chart  NPO for OR Preop antibiotics   Berline Lopes, MD 09/17/2020, 8:51 AM

## 2020-09-17 NOTE — Anesthesia Procedure Notes (Signed)
Procedure Name: Intubation Performed by: Rosaland Lao, CRNA Pre-anesthesia Checklist: Patient identified, Emergency Drugs available, Suction available and Patient being monitored Patient Re-evaluated:Patient Re-evaluated prior to induction Oxygen Delivery Method: Circle system utilized Preoxygenation: Pre-oxygenation with 100% oxygen Induction Type: IV induction Ventilation: Mask ventilation without difficulty Laryngoscope Size: Mac and 4 Grade View: Grade I Tube type: Oral Number of attempts: 1 Airway Equipment and Method: Stylet Placement Confirmation: ETT inserted through vocal cords under direct vision,  positive ETCO2 and breath sounds checked- equal and bilateral Secured at: 22 cm Tube secured with: Tape Dental Injury: Teeth and Oropharynx as per pre-operative assessment

## 2020-09-17 NOTE — Transfer of Care (Signed)
Immediate Anesthesia Transfer of Care Note  Patient: Roger Robbins  Procedure(s) Performed: REVERSE SHOULDER ARTHROPLASTY (Right Shoulder)  Patient Location: PACU  Anesthesia Type:General  Level of Consciousness: awake, alert , oriented and patient cooperative  Airway & Oxygen Therapy: Patient Spontanous Breathing and Patient connected to face mask oxygen  Post-op Assessment: Report given to RN and Post -op Vital signs reviewed and stable  Post vital signs: Reviewed and stable  Last Vitals:  Vitals Value Taken Time  BP 192/75 09/17/20 1110  Temp    Pulse 55 09/17/20 1112  Resp 13 09/17/20 1112  SpO2 100 % 09/17/20 1112  Vitals shown include unvalidated device data.  Last Pain:  Vitals:   09/17/20 0808  TempSrc:   PainSc: 0-No pain         Complications: No complications documented.

## 2020-09-17 NOTE — Anesthesia Preprocedure Evaluation (Signed)
Anesthesia Evaluation  Patient identified by MRN, date of birth, ID band Patient awake    Reviewed: Allergy & Precautions, NPO status , Patient's Chart, lab work & pertinent test results, reviewed documented beta blocker date and time   Airway Mallampati: II  TM Distance: >3 FB Neck ROM: Full    Dental  (+) Teeth Intact   Pulmonary neg pulmonary ROS, former smoker,    Pulmonary exam normal        Cardiovascular hypertension, Pt. on medications and Pt. on home beta blockers + dysrhythmias (PVCs)  Rhythm:Regular Rate:Normal     Neuro/Psych negative neurological ROS  negative psych ROS   GI/Hepatic negative GI ROS, Neg liver ROS,   Endo/Other  Hypothyroidism   Renal/GU negative Renal ROS  negative genitourinary   Musculoskeletal  (+) Arthritis , Osteoarthritis,    Abdominal (+)  Abdomen: soft. Bowel sounds: normal.  Peds  Hematology negative hematology ROS (+)   Anesthesia Other Findings   Reproductive/Obstetrics                             Anesthesia Physical Anesthesia Plan  ASA: III  Anesthesia Plan: Regional and General   Post-op Pain Management:  Regional for Post-op pain   Induction: Intravenous  PONV Risk Score and Plan: 2 and Ondansetron, Dexamethasone, Midazolam and Treatment may vary due to age or medical condition  Airway Management Planned: Mask and Oral ETT  Additional Equipment: None  Intra-op Plan:   Post-operative Plan: Extubation in OR  Informed Consent: I have reviewed the patients History and Physical, chart, labs and discussed the procedure including the risks, benefits and alternatives for the proposed anesthesia with the patient or authorized representative who has indicated his/her understanding and acceptance.     Dental advisory given  Plan Discussed with: CRNA  Anesthesia Plan Comments: (Lab Results      Component                Value                Date                      WBC                      7.9                 09/09/2020                HGB                      13.0                09/09/2020                HCT                      39.0                09/09/2020                MCV                      97.3                09/09/2020  PLT                      276                 09/09/2020           Lab Results      Component                Value               Date                      NA                       139                 09/09/2020                K                        4.7                 09/09/2020                CO2                      27                  09/09/2020                GLUCOSE                  100 (H)             09/09/2020                BUN                      18                  09/09/2020                CREATININE               1.36 (H)            09/09/2020                CALCIUM                  9.1                 09/09/2020                GFRNONAA                 56 (L)              09/09/2020          )        Anesthesia Quick Evaluation

## 2020-09-17 NOTE — Anesthesia Postprocedure Evaluation (Signed)
Anesthesia Post Note  Patient: Roger Robbins  Procedure(s) Performed: REVERSE SHOULDER ARTHROPLASTY (Right Shoulder)     Patient location during evaluation: PACU Anesthesia Type: Regional and General Level of consciousness: awake and alert Pain management: pain level controlled Vital Signs Assessment: post-procedure vital signs reviewed and stable Respiratory status: spontaneous breathing, nonlabored ventilation, respiratory function stable and patient connected to nasal cannula oxygen Cardiovascular status: blood pressure returned to baseline and stable Postop Assessment: no apparent nausea or vomiting Anesthetic complications: no   No complications documented.  Last Vitals:  Vitals:   09/17/20 1143 09/17/20 1215  BP:  (!) 169/82  Pulse: (!) 47 (!) 48  Resp: 15 (!) 6  Temp:  (!) 36.1 C  SpO2: 96% 94%    Last Pain:  Vitals:   09/17/20 1215  TempSrc:   PainSc: 5                  Tenise Stetler P Attallah Ontko

## 2020-09-17 NOTE — Anesthesia Procedure Notes (Signed)
Anesthesia Regional Block: Interscalene brachial plexus block   Pre-Anesthetic Checklist: ,, timeout performed, Correct Patient, Correct Site, Correct Laterality, Correct Procedure, Correct Position, site marked, Risks and benefits discussed,  Surgical consent,  Pre-op evaluation,  At surgeon's request and post-op pain management  Laterality: Right  Prep: Dura Prep       Needles:  Injection technique: Single-shot  Needle Type: Echogenic Stimulator Needle     Needle Length: 5cm  Needle Gauge: 20     Additional Needles:   Procedures:,,,, ultrasound used (permanent image in chart),,,,  Narrative:  Start time: 09/17/2020 8:35 AM End time: 09/17/2020 8:39 AM Injection made incrementally with aspirations every 5 mL.  Performed by: Personally  Anesthesiologist: Atilano Median, DO  Additional Notes: Patient identified. Risks/Benefits/Options discussed with patient including but not limited to bleeding, infection, nerve damage, failed block, incomplete pain control. Patient expressed understanding and wished to proceed. All questions were answered. Sterile technique was used throughout the entire procedure. Please see nursing notes for vital signs. Aspirated in 5cc intervals with injection for negative confirmation. Patient was given instructions on fall risk and not to get out of bed. All questions and concerns addressed with instructions to call with any issues or inadequate analgesia.

## 2020-09-17 NOTE — Discharge Instructions (Signed)

## 2020-09-17 NOTE — Progress Notes (Signed)
Assisted Dr. Greg Stoltzfus with right, ultrasound guided, interscalene  block. Side rails up, monitors on throughout procedure. See vital signs in flow sheet. Tolerated Procedure well. °

## 2020-09-18 ENCOUNTER — Encounter (HOSPITAL_COMMUNITY): Payer: Self-pay | Admitting: Orthopedic Surgery

## 2023-02-17 IMAGING — DX DG SHOULDER 2+V PORT*R*
1 series · 1 of 1 positions shown · non-contrast
Comparison: None.

CLINICAL DATA: Post right shoulder arthroplasty

EXAM:
PORTABLE RIGHT SHOULDER

[shoulder ap]
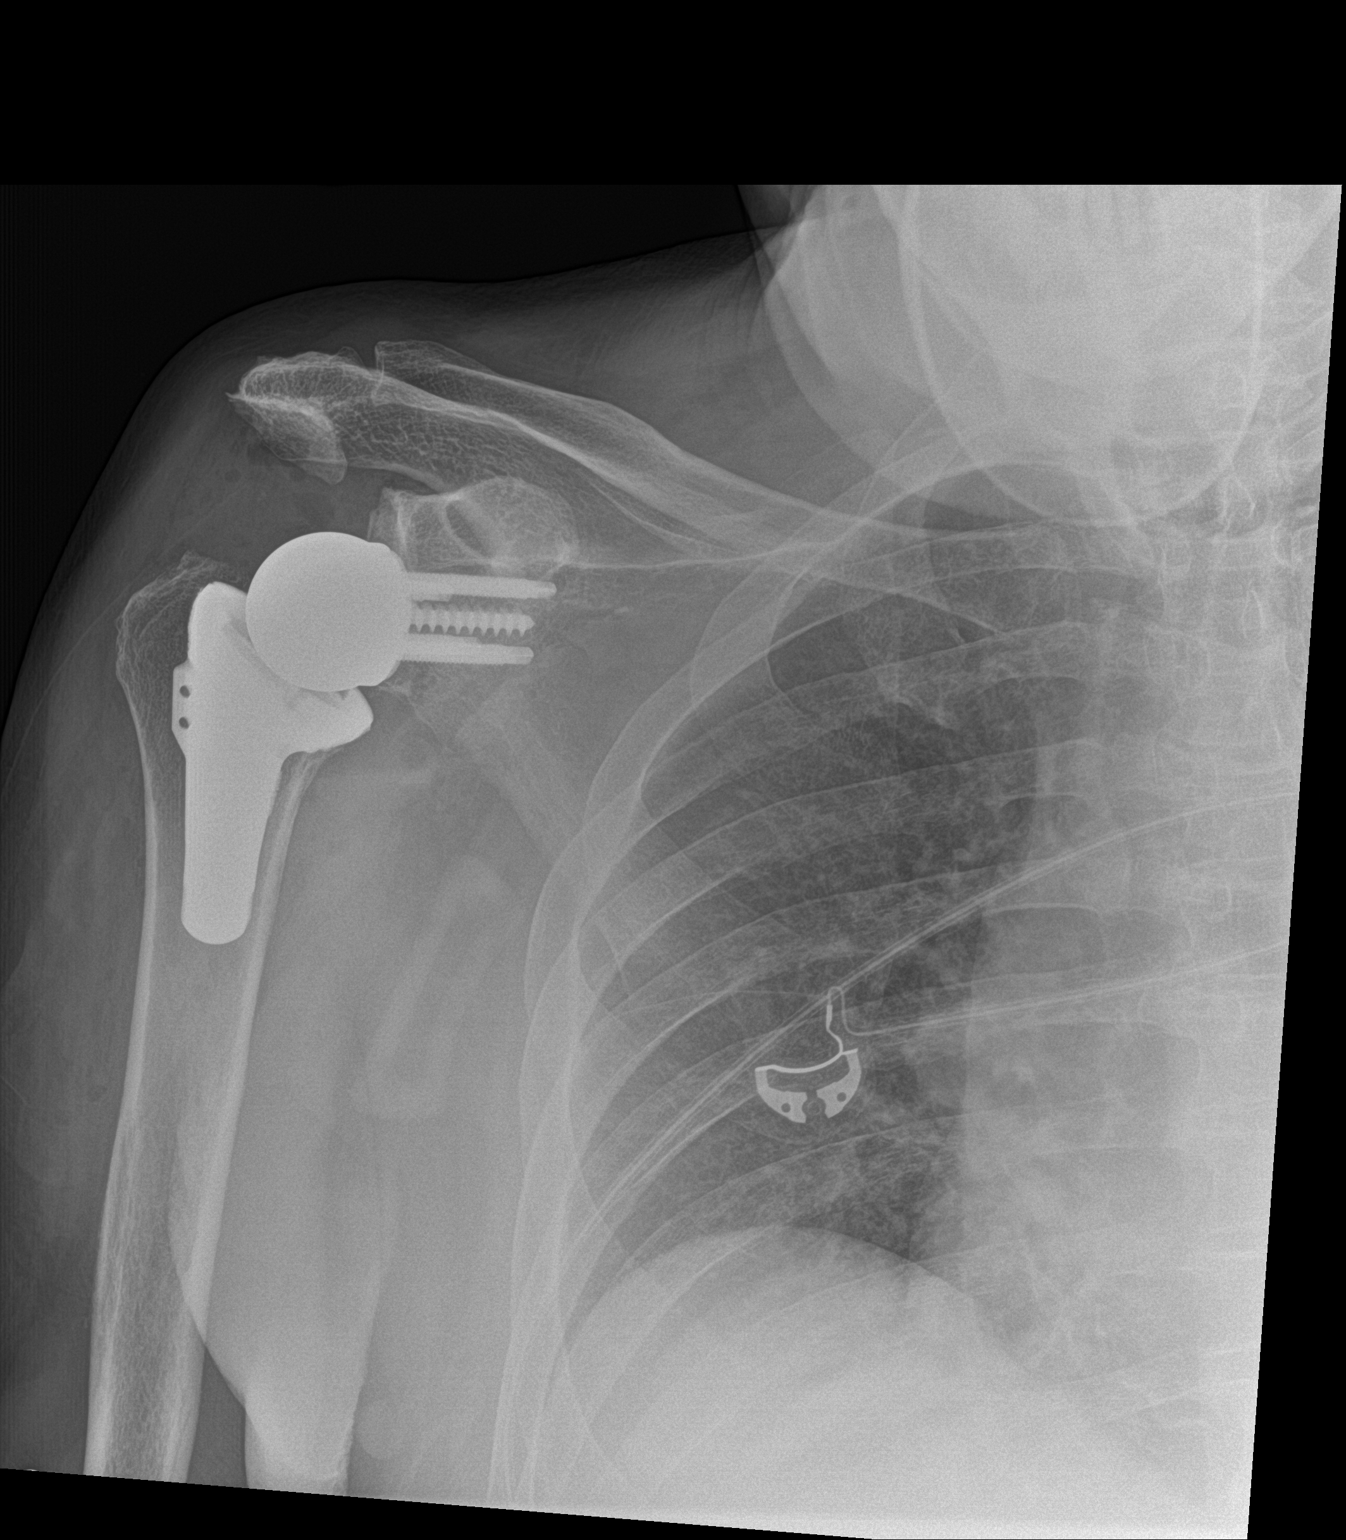

[1 of 1 positions shown; findings below may reference images not displayed]

FINDINGS: Right shoulder replacement noted. Normal AP alignment. No hardware
bony complicating feature. Degenerative changes in the right AC
joint.
IMPRESSION: Right shoulder replacement.  No visible complicating feature.

## 2024-09-01 DEATH — deceased
